# Patient Record
Sex: Male | Born: 1995 | Race: Black or African American | Hispanic: No | Marital: Single | Smoking: Current every day smoker
Health system: Southern US, Community
[De-identification: ages and names within clinical notes are randomized; demographics above are authoritative.]

---
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
## 2016-04-10 ENCOUNTER — Encounter (HOSPITAL_COMMUNITY): Payer: Self-pay | Admitting: *Deleted

## 2016-04-10 ENCOUNTER — Emergency Department (HOSPITAL_COMMUNITY): Payer: Medicaid Other

## 2016-04-10 ENCOUNTER — Emergency Department (HOSPITAL_COMMUNITY)
Admission: EM | Admit: 2016-04-10 | Discharge: 2016-04-10 | Disposition: A | Discharge status: Home / Self Care | Payer: Medicaid Other | Attending: Emergency Medicine | Admitting: Emergency Medicine

## 2016-04-10 DIAGNOSIS — Y939 Activity, unspecified: Secondary | ICD-10-CM | POA: Diagnosis not present

## 2016-04-10 DIAGNOSIS — S022XXA Fracture of nasal bones, initial encounter for closed fracture: Secondary | ICD-10-CM | POA: Diagnosis not present

## 2016-04-10 DIAGNOSIS — S0990XA Unspecified injury of head, initial encounter: Secondary | ICD-10-CM | POA: Insufficient documentation

## 2016-04-10 DIAGNOSIS — Y929 Unspecified place or not applicable: Secondary | ICD-10-CM | POA: Diagnosis not present

## 2016-04-10 DIAGNOSIS — Z791 Long term (current) use of non-steroidal anti-inflammatories (NSAID): Secondary | ICD-10-CM | POA: Insufficient documentation

## 2016-04-10 DIAGNOSIS — W2209XA Striking against other stationary object, initial encounter: Secondary | ICD-10-CM | POA: Diagnosis not present

## 2016-04-10 DIAGNOSIS — S0993XA Unspecified injury of face, initial encounter: Secondary | ICD-10-CM | POA: Diagnosis present

## 2016-04-10 DIAGNOSIS — Y999 Unspecified external cause status: Secondary | ICD-10-CM | POA: Insufficient documentation

## 2016-04-10 MED ORDER — NAPROXEN 500 MG PO TABS: 500.0000 mg | ORAL_TABLET | Freq: Two times a day (BID) | ORAL | Status: DC

## 2016-04-10 MED ORDER — OXYCODONE-ACETAMINOPHEN 5-325 MG PO TABS
1.0000 | ORAL_TABLET | ORAL | Status: DC | PRN
  Administered 2016-04-10: 1 via ORAL

## 2016-04-10 MED ORDER — OXYCODONE-ACETAMINOPHEN 5-325 MG PO TABS
ORAL_TABLET | ORAL | Status: AC
  Filled 2016-04-10: qty 1

## 2016-04-10 NOTE — ED Notes (Signed)
Pt not in room at time of discharge, did not receive paperwork or prescription for Naproxen.

## 2016-04-10 NOTE — Discharge Instructions (Signed)
Please read and follow all provided instructions.  Your diagnoses today include:  1. Nasal bone fracture, closed, initial encounter   2. Minor head injury, initial encounter     Tests performed today include:  CT scan of your face that showed nasal bone fractures and swelling, no other concerning injury.  Vital signs. See below for your results today.   Medications prescribed:   Naproxen - anti-inflammatory pain medication  Do not exceed 500mg  naproxen every 12 hours, take with food  You have been prescribed an anti-inflammatory medication or NSAID. Take with food. Take smallest effective dose for the shortest duration needed for your pain. Stop taking if you experience stomach pain or vomiting.   Take any prescribed medications only as directed.  Home care instructions:  Put ice on the area 15 minutes every hour and use NSAIDs for pain.   Follow-up instructions: Please follow-up with the doctor listed if you do not like the way your nose looks or you have difficulty breathing though your nose in one week.  Return instructions:  SEEK IMMEDIATE MEDICAL ATTENTION IF:  There is confusion or drowsiness (although children frequently become drowsy after injury).   You cannot awaken the injured person.   You have more than one episode of vomiting.   You notice dizziness or unsteadiness which is getting worse, or inability to walk.   You have convulsions or unconsciousness.   You experience severe, persistent headaches not relieved by Tylenol.  You cannot use arms or legs normally.   There are changes in pupil sizes. (This is the black center in the colored part of the eye)   There is clear or bloody discharge from the nose or ears.   You have change in speech, vision, swallowing, or understanding.   Localized weakness, numbness, tingling, or change in bowel or bladder control.  You have any other emergent concerns.  Additional Information: You have had a head injury  which does not appear to require admission at this time.  Your vital signs today were: BP 117/72 mmHg   Pulse 86   Temp(Src) 99.5 F (37.5 C) (Oral)   Resp 14   SpO2 99% If your blood pressure (BP) was elevated above 135/85 this visit, please have this repeated by your doctor within one month. --------------
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
## 2020-01-20 ENCOUNTER — Ambulatory Visit: Payer: Medicaid Other | Attending: Internal Medicine

## 2020-01-20 DIAGNOSIS — Z23 Encounter for immunization: Secondary | ICD-10-CM

## 2020-01-20 NOTE — Progress Notes (Signed)
   Covid-19 Vaccination Clinic  Name:  Bryan Ramsey    MRN: [id number] DOB: [birth year]  01/20/2020  Bryan Ramsey was observed post Covid-19 immunization for 15 minutes without incident. He was provided with Vaccine Information Sheet and instruction to access the V-Safe system.   Bryan Ramsey was instructed to call 911 with any severe reactions post vaccine: Marland Kitchen Difficulty breathing  . Swelling of face and throat  . A fast heartbeat  . A bad rash all over body  . Dizziness and weakness   Immunizations Administered    Name Date Dose VIS Date Route   Moderna COVID-19 Vaccine 01/20/2020 10:27 AM 0.5 mL 09/19/2019 Intramuscular   Manufacturer: Gala Murdoch   Lot: [id number]   NDC: 73578-978-47

## 2022-08-21 ENCOUNTER — Other Ambulatory Visit (HOSPITAL_COMMUNITY): Payer: Self-pay

## 2022-08-21 ENCOUNTER — Emergency Department (HOSPITAL_COMMUNITY)
Admission: EM | Admit: 2022-08-21 | Discharge: 2022-08-21 | Disposition: A | Discharge status: Home / Self Care | Payer: Medicaid Other | Attending: Emergency Medicine | Admitting: Emergency Medicine

## 2022-08-21 ENCOUNTER — Other Ambulatory Visit: Payer: Self-pay

## 2022-08-21 ENCOUNTER — Encounter (HOSPITAL_COMMUNITY): Payer: Self-pay

## 2022-08-21 DIAGNOSIS — Z202 Contact with and (suspected) exposure to infections with a predominantly sexual mode of transmission: Secondary | ICD-10-CM

## 2022-08-21 DIAGNOSIS — R590 Localized enlarged lymph nodes: Secondary | ICD-10-CM | POA: Insufficient documentation

## 2022-08-21 LAB — URINALYSIS, ROUTINE W REFLEX MICROSCOPIC
Bilirubin Urine: NEGATIVE
Glucose, UA: NEGATIVE mg/dL
Hgb urine dipstick: NEGATIVE
Ketones, ur: NEGATIVE mg/dL
Leukocytes,Ua: NEGATIVE
Nitrite: NEGATIVE
Protein, ur: NEGATIVE mg/dL
Specific Gravity, Urine: 1.023 (ref 1.005–1.030)
pH: 5 (ref 5.0–8.0)

## 2022-08-21 LAB — HIV ANTIBODY (ROUTINE TESTING W REFLEX): HIV Screen 4th Generation wRfx: NONREACTIVE

## 2022-08-21 LAB — RPR: RPR Ser Ql: NONREACTIVE

## 2022-08-21 MED ORDER — CEFTRIAXONE SODIUM 500 MG IJ SOLR
500.0000 mg | Freq: Once | INTRAMUSCULAR | Status: AC
  Administered 2022-08-21: 500 mg via INTRAMUSCULAR
  Filled 2022-08-21: qty 500

## 2022-08-21 MED ORDER — DOXYCYCLINE HYCLATE 100 MG PO TABS
100.0000 mg | ORAL_TABLET | Freq: Once | ORAL | Status: AC
  Administered 2022-08-21: 100 mg via ORAL
  Filled 2022-08-21: qty 1

## 2022-08-21 MED ORDER — STERILE WATER FOR INJECTION IJ SOLN
INTRAMUSCULAR | Status: AC
  Administered 2022-08-21: 10 mL
  Filled 2022-08-21: qty 10

## 2022-08-21 MED ORDER — DOXYCYCLINE HYCLATE 100 MG PO CAPS
100.0000 mg | ORAL_CAPSULE | Freq: Two times a day (BID) | ORAL | 0 refills | Status: DC
  Filled 2022-08-21: qty 20, 10d supply, fill #0

## 2022-08-21 NOTE — ED Provider Notes (Signed)
[facility] EMERGENCY DEPARTMENT Provider Note   CSN: [id number] Arrival date & time: 08/21/22  0856     History  Chief Complaint  Patient presents with   Exposure to STD    Bryan Ramsey is a 26 y.o. male.   Exposure to STD     Patient with noncontributory past medical history presents today due to STD exposure.  He has had multiple new sexual partners, they have not tested positive for STDs but he started having some intermittent penile discharge.  He also has swollen lymph nodes in his groin area.  Denies any new rashes or lesions.  No fevers or chills, abdominal pain.  Home Medications Prior to Admission medications   Medication Sig Start Date End Date Taking? Authorizing Provider  doxycycline (VIBRAMYCIN) 100 MG capsule Take 1 capsule (100 mg total) by mouth 2 (two) times daily. 08/21/22  Yes Sherrill Raring, PA-C  ibuprofen (ADVIL,MOTRIN) 600 MG tablet Take 1 tablet (600 mg total) by mouth every 8 (eight) hours as needed for pain. 09/09/12   Moreno-Coll, Adlih, MD  ibuprofen (ADVIL,MOTRIN) 800 MG tablet Take 1 tablet (800 mg total) by mouth every 8 (eight) hours as needed for moderate pain. 05/27/14   Larene Pickett, PA-C  mupirocin ointment (BACTROBAN) 2 % Apply 1 application topically 2 (two) times daily. 06/07/14   Waldemar Dickens, MD  naproxen (NAPROSYN) 500 MG tablet Take 1 tablet (500 mg total) by mouth 2 (two) times daily. 04/10/16   Carlisle Cater, PA-C      Allergies    Patient has no known allergies.    Review of Systems   Review of Systems  Physical Exam Updated Vital Signs BP 119/60 (BP Location: Right Arm)   Pulse 62   Temp 97.7 F (36.5 C) (Oral)   Resp 15   Ht 5\' 11"  (1.803 m)   Wt 77.1 kg   SpO2 100%   BMI 23.71 kg/m  Physical Exam Vitals and nursing note reviewed. Exam conducted with a chaperone present.  Constitutional:      Appearance: Normal appearance.  HENT:     Head: Normocephalic and atraumatic.  Eyes:     General: No  scleral icterus.       Right eye: No discharge.        Left eye: No discharge.     Extraocular Movements: Extraocular movements intact.     Pupils: Pupils are equal, round, and reactive to light.  Cardiovascular:     Rate and Rhythm: Normal rate and regular rhythm.     Pulses: Normal pulses.     Heart sounds: Normal heart sounds. No murmur heard.    No friction rub. No gallop.  Pulmonary:     Effort: Pulmonary effort is normal. No respiratory distress.     Breath sounds: Normal breath sounds.  Abdominal:     General: Abdomen is flat. Bowel sounds are normal. There is no distension.     Palpations: Abdomen is soft.     Tenderness: There is no abdominal tenderness.  Genitourinary:    Comments: Mild inguinal lymphadenopathy bilaterally Skin:    General: Skin is warm and dry.     Coloration: Skin is not jaundiced.  Neurological:     Mental Status: He is alert. Mental status is at baseline.     Coordination: Coordination normal.     ED Results / Procedures / Treatments   Labs (all labs ordered are listed, but only abnormal results are displayed) Labs Reviewed  HIV ANTIBODY (ROUTINE TESTING W REFLEX)  URINALYSIS, ROUTINE W REFLEX MICROSCOPIC  RPR  GC/CHLAMYDIA PROBE AMP ([place]) NOT AT [facility]    EKG None  Radiology No results found.  Procedures Procedures    Medications Ordered in ED Medications  cefTRIAXone (ROCEPHIN) injection 500 mg (has no administration in time range)  doxycycline (VIBRA-TABS) tablet 100 mg (has no administration in time range)    ED Course/ Medical Decision Making/ A&P                           Medical Decision Making Amount and/or Complexity of Data Reviewed Labs: ordered.  Risk Prescription drug management.   Patient presents wanting STD testing and possible treatment.  He is nonseptic appearing, his abdomen is soft nontender.  He has some mild lymphadenopathy but no appreciable discharge, chancre, rashes, skin discoloration.   Empirically will treat with Rocephin and Doxy.  Follow-up at health department and PCP was discussed with the patient who verbalized understanding.  Return precautions also discussed.  Do not think any additional work-up is indicated.        Final Clinical Impression(s) / ED Diagnoses Final diagnoses:  Possible exposure to STD    Rx / DC Orders ED Discharge Orders          Ordered    doxycycline (VIBRAMYCIN) 100 MG capsule  2 times daily        08/21/22 1236              Sherrill Raring, [place] 08/21/22 1245    Valarie Merino, MD 08/22/22 1308

## 2022-08-21 NOTE — Discharge Instructions (Addendum)
Check MyChart for the results of your STD screen.  If you have syphilis you will need to be treated with a different antibiotic.  If the chlamydia is positive you will need to continue the doxycycline.  If negative you do not need to continue the doxycycline.  You are treated empirically for gonorrhea here in the emergency department with the shot.  Follow-up with the health department in future for all your STD testing needs.  Return to the ED for emergent symptoms.
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
## 2022-08-24 LAB — GC/CHLAMYDIA PROBE AMP (~~LOC~~) NOT AT ARMC
Chlamydia: NEGATIVE
Comment: NEGATIVE
Comment: NORMAL
Neisseria Gonorrhea: NEGATIVE

## 2022-08-25 ENCOUNTER — Emergency Department (HOSPITAL_COMMUNITY)
Admission: EM | Admit: 2022-08-25 | Discharge: 2022-08-26 | Disposition: A | Discharge status: Home / Self Care | Payer: Medicaid Other | Attending: Emergency Medicine | Admitting: Emergency Medicine

## 2022-08-25 ENCOUNTER — Encounter (HOSPITAL_COMMUNITY): Payer: Self-pay | Admitting: Emergency Medicine

## 2022-08-25 ENCOUNTER — Emergency Department (HOSPITAL_COMMUNITY): Payer: Medicaid Other

## 2022-08-25 ENCOUNTER — Other Ambulatory Visit: Payer: Self-pay

## 2022-08-25 DIAGNOSIS — S0083XA Contusion of other part of head, initial encounter: Secondary | ICD-10-CM

## 2022-08-25 DIAGNOSIS — S20419A Abrasion of unspecified back wall of thorax, initial encounter: Secondary | ICD-10-CM

## 2022-08-25 DIAGNOSIS — S0081XA Abrasion of other part of head, initial encounter: Secondary | ICD-10-CM

## 2022-08-25 DIAGNOSIS — Y9289 Other specified places as the place of occurrence of the external cause: Secondary | ICD-10-CM | POA: Insufficient documentation

## 2022-08-25 DIAGNOSIS — S300XXA Contusion of lower back and pelvis, initial encounter: Secondary | ICD-10-CM
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
## 2022-08-31 ENCOUNTER — Other Ambulatory Visit (HOSPITAL_COMMUNITY): Payer: Self-pay

## 2022-09-18 DIAGNOSIS — Z419 Encounter for procedure for purposes other than remedying health state, unspecified: Secondary | ICD-10-CM | POA: Diagnosis not present

## 2022-10-19 DIAGNOSIS — Z419 Encounter for procedure for purposes other than remedying health state, unspecified: Secondary | ICD-10-CM | POA: Diagnosis not present

## 2023-02-15 ENCOUNTER — Emergency Department (HOSPITAL_COMMUNITY)
Admission: EM | Admit: 2023-02-15 | Discharge: 2023-02-15 | Discharge status: Against Medical Advice | Payer: Medicaid Other

## 2023-02-15 ENCOUNTER — Other Ambulatory Visit: Payer: Self-pay

## 2023-02-15 NOTE — ED Notes (Signed)
No answer when called for triage 

## 2023-02-15 NOTE — ED Notes (Signed)
No answer for triage x3 

## 2023-02-24 ENCOUNTER — Other Ambulatory Visit: Payer: Self-pay

## 2023-02-24 ENCOUNTER — Ambulatory Visit (HOSPITAL_COMMUNITY)
Admission: EM | Admit: 2023-02-24 | Discharge: 2023-02-24 | Disposition: A | Discharge status: Home / Self Care | Payer: Medicaid Other | Attending: Emergency Medicine | Admitting: Emergency Medicine

## 2023-02-24 ENCOUNTER — Other Ambulatory Visit (HOSPITAL_COMMUNITY): Payer: Self-pay

## 2023-02-24 ENCOUNTER — Encounter (HOSPITAL_COMMUNITY): Payer: Self-pay | Admitting: Emergency Medicine

## 2023-02-24 DIAGNOSIS — H1131 Conjunctival hemorrhage, right eye: Secondary | ICD-10-CM

## 2023-02-24 DIAGNOSIS — S0501XA Injury of conjunctiva and corneal abrasion without foreign body, right eye, initial encounter: Secondary | ICD-10-CM

## 2023-02-24 DIAGNOSIS — H10021 Other mucopurulent conjunctivitis, right eye: Secondary | ICD-10-CM

## 2023-02-24 MED ORDER — GENTAMICIN SULFATE 0.3 % OP SOLN
1.0000 [drp] | OPHTHALMIC | 0 refills | Status: AC
  Filled 2023-02-24: qty 5, 5d supply, fill #0

## 2023-02-24 NOTE — Discharge Instructions (Addendum)
You have a subconjunctival hemorrhage of your right eye, I believe this was due to trauma and corneal abrasion.  I would to cover you with gentamicin drops to prevent against bacterial infection.  You can do a cool rag to your eye to help with the swelling and discomfort.  Please use a clean rag or clean section every times you do not reinfect yourself.  I have attached the information for an ophthalmologist, please call them if your conjunctival hemorrhage does not improve over the next few weeks, you have sudden vision loss, eye pain, or any new concerning symptoms.  If you are unable to get into their office, please seek immediate care.
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
## 2023-02-25 ENCOUNTER — Other Ambulatory Visit (HOSPITAL_COMMUNITY): Payer: Self-pay

## 2023-03-09 ENCOUNTER — Other Ambulatory Visit (HOSPITAL_COMMUNITY): Payer: Self-pay

## 2024-02-27 ENCOUNTER — Emergency Department (HOSPITAL_COMMUNITY)

## 2024-02-27 ENCOUNTER — Other Ambulatory Visit: Payer: Self-pay

## 2024-02-27 ENCOUNTER — Emergency Department (HOSPITAL_COMMUNITY)
Admission: EM | Admit: 2024-02-27 | Discharge: 2024-02-27 | Disposition: A | Discharge status: Home / Self Care | Attending: Emergency Medicine | Admitting: Emergency Medicine

## 2024-02-27 ENCOUNTER — Encounter (HOSPITAL_COMMUNITY): Payer: Self-pay

## 2024-02-27 DIAGNOSIS — S6992XA Unspecified injury of left wrist, hand and finger(s), initial encounter: Secondary | ICD-10-CM | POA: Diagnosis present

## 2024-02-27 DIAGNOSIS — S81812A Laceration without foreign body, left lower leg, initial encounter: Secondary | ICD-10-CM

## 2024-02-27 DIAGNOSIS — S61217A Laceration without foreign body of left little finger without damage to nail, initial encounter: Secondary | ICD-10-CM | POA: Diagnosis not present

## 2024-02-27 DIAGNOSIS — S61412A Laceration without foreign body of left hand, initial encounter: Secondary | ICD-10-CM | POA: Insufficient documentation

## 2024-02-27 DIAGNOSIS — S61215A Laceration without foreign body of left ring finger without damage to nail, initial encounter: Secondary | ICD-10-CM | POA: Diagnosis not present

## 2024-02-27 DIAGNOSIS — S91012A Laceration without foreign body, left ankle, initial encounter: Secondary | ICD-10-CM | POA: Insufficient documentation

## 2024-02-27 MED ORDER — LIDOCAINE HCL (PF) 1 % IJ SOLN
10.0000 mL | Freq: Once | INTRAMUSCULAR | Status: AC
  Administered 2024-02-27: 10 mL
  Filled 2024-02-27: qty 10

## 2024-02-27 MED ORDER — SENNOSIDES-DOCUSATE SODIUM 8.6-50 MG PO TABS: 1.0000 | ORAL_TABLET | Freq: Every day | ORAL | 0 refills | Status: AC

## 2024-02-27 MED ORDER — OXYCODONE-ACETAMINOPHEN 5-325 MG PO TABS: 1.0000 | ORAL_TABLET | Freq: Four times a day (QID) | ORAL | 0 refills | Status: DC | PRN

## 2024-02-27 MED ORDER — DOXYCYCLINE HYCLATE 100 MG PO CAPS: 100.0000 mg | ORAL_CAPSULE | Freq: Two times a day (BID) | ORAL | 0 refills | Status: AC

## 2024-02-27 MED ORDER — OXYCODONE-ACETAMINOPHEN 5-325 MG PO TABS
1.0000 | ORAL_TABLET | Freq: Once | ORAL | Status: AC
  Administered 2024-02-27: 1 via ORAL
  Filled 2024-02-27: qty 1

## 2024-02-27 MED ORDER — DOXYCYCLINE HYCLATE 100 MG PO TABS
100.0000 mg | ORAL_TABLET | Freq: Once | ORAL | Status: AC
  Administered 2024-02-27: 100 mg via ORAL
  Filled 2024-02-27: qty 1

## 2024-02-27 MED ORDER — LIDOCAINE-EPINEPHRINE (PF) 2 %-1:200000 IJ SOLN
10.0000 mL | Freq: Once | INTRAMUSCULAR | Status: AC
  Administered 2024-02-27: 10 mL
  Filled 2024-02-27: qty 20
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
## 2024-02-27 NOTE — Discharge Instructions (Signed)
 Please keep your hand and leg clean and dry. Please start the antibiotics and take as directed. You will need to return in 7 days to have your stitches removed. If you develop numbness in the hand you should call the surgeon listed for close follow up.

## 2024-02-27 NOTE — ED Provider Notes (Cosign Needed Addendum)
 LACERATION REPAIR Performed by: Adel Aden Authorized by: Adel Aden Consent: Verbal consent obtained. Risks and benefits: risks, benefits and alternatives were discussed Consent given by: patient Patient identity confirmed: provided demographic data Prepped and Draped in normal sterile fashion Wound explored  Laceration Location: distal left ankle  Laceration Length: 4cm  No Foreign Bodies seen or palpated  Anesthesia: local infiltration  Local anesthetic: lidocaine  2% w epinephrine  Anesthetic total: 5 ml  Irrigation method: syringe Amount of cleaning: standard  Skin closure: close   Number of sutures: 6  Technique: simple  Patient tolerance: Patient tolerated the procedure well with no immediate complications.          LACERATION REPAIR Performed by: Adel Aden Authorized by: Adel Aden Consent: Verbal consent obtained. Risks and benefits: risks, benefits and alternatives were discussed Consent given by: patient Patient identity confirmed: provided demographic data Prepped and Draped in normal sterile fashion Wound explored  Laceration Location: distal left ankle   Laceration Length: 2.5cm  No Foreign Bodies seen or palpated  Anesthesia: local infiltration  Local anesthetic: lidocaine  2% w epinephrine  Anesthetic total: 5 ml  Irrigation method: syringe Amount of cleaning: standard  Skin closure: close  Number of sutures: 3  Technique: simple repair  Patient tolerance: Patient tolerated the procedure well with no immediate complications.      Adel Aden, PA-C 02/27/24 0540    Adel Aden, PA-C 02/27/24 0541    Long, Joshua G, MD 02/28/24 [phone]
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
## 2024-03-03 ENCOUNTER — Ambulatory Visit (HOSPITAL_COMMUNITY)
Admission: EM | Admit: 2024-03-03 | Discharge: 2024-03-03 | Disposition: A | Discharge status: Home / Self Care | Attending: Physician Assistant | Admitting: Physician Assistant

## 2024-03-03 ENCOUNTER — Encounter (HOSPITAL_COMMUNITY): Payer: Self-pay

## 2024-03-03 ENCOUNTER — Other Ambulatory Visit (HOSPITAL_COMMUNITY): Payer: Self-pay

## 2024-03-03 DIAGNOSIS — S91012D Laceration without foreign body, left ankle, subsequent encounter: Secondary | ICD-10-CM | POA: Diagnosis not present

## 2024-03-03 DIAGNOSIS — Z5189 Encounter for other specified aftercare: Secondary | ICD-10-CM | POA: Diagnosis not present

## 2024-03-03 DIAGNOSIS — S61412D Laceration without foreign body of left hand, subsequent encounter: Secondary | ICD-10-CM

## 2024-03-03 MED ORDER — HYDROCODONE-ACETAMINOPHEN 5-325 MG PO TABS
1.0000 | ORAL_TABLET | Freq: Two times a day (BID) | ORAL | 0 refills | Status: AC | PRN
  Filled 2024-03-03 – 2024-03-04 (×2): qty 6, 3d supply, fill #0

## 2024-03-03 NOTE — Discharge Instructions (Signed)
 I have called in a few doses of hydrocodone.  As we discussed, I am unable to provide any refills of this medication.  Try to limit use is much as possible that is both addictive and sedating.  Do not drive or drink alcohol with taking this medication.  Continue the antibiotics as prescribed.  It is very important that you follow-up with a hand specialist as soon as possible.  Please call them and schedule an appointment today.  If you have any worsening symptoms including discoloration of your finger, increasing numbness, wound you need to go to the emergency room immediately.
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
## 2024-03-04 ENCOUNTER — Other Ambulatory Visit (HOSPITAL_COMMUNITY): Payer: Self-pay

## 2024-03-06 ENCOUNTER — Other Ambulatory Visit (HOSPITAL_COMMUNITY): Payer: Self-pay

## 2024-03-11 ENCOUNTER — Other Ambulatory Visit: Payer: Self-pay

## 2024-03-11 ENCOUNTER — Observation Stay (HOSPITAL_COMMUNITY)
Admission: EM | Admit: 2024-03-11 | Discharge: 2024-03-12 | Disposition: A | Discharge status: Home / Self Care | Source: Home / Self Care | Attending: Emergency Medicine | Admitting: Emergency Medicine

## 2024-03-11 ENCOUNTER — Ambulatory Visit (HOSPITAL_COMMUNITY): Admission: EM | Admit: 2024-03-11 | Discharge: 2024-03-11 | Disposition: A | Discharge status: Home / Self Care

## 2024-03-11 ENCOUNTER — Encounter (HOSPITAL_COMMUNITY): Payer: Self-pay

## 2024-03-11 ENCOUNTER — Observation Stay (HOSPITAL_COMMUNITY)
Admission: EM | Admit: 2024-03-11 | Discharge: 2024-03-11 | Discharge status: Against Medical Advice | Attending: Emergency Medicine | Admitting: Emergency Medicine

## 2024-03-11 ENCOUNTER — Emergency Department (HOSPITAL_COMMUNITY)

## 2024-03-11 ENCOUNTER — Emergency Department (HOSPITAL_COMMUNITY)
Admission: EM | Admit: 2024-03-11 | Discharge: 2024-03-11 | Discharge status: Against Medical Advice | Source: Home / Self Care

## 2024-03-11 DIAGNOSIS — S0993XA Unspecified injury of face, initial encounter: Secondary | ICD-10-CM | POA: Diagnosis present

## 2024-03-11 DIAGNOSIS — Z5321 Procedure and treatment not carried out due to patient leaving prior to being seen by health care provider: Secondary | ICD-10-CM | POA: Insufficient documentation

## 2024-03-11 DIAGNOSIS — S00502A Unspecified superficial injury of oral cavity, initial encounter: Secondary | ICD-10-CM | POA: Insufficient documentation

## 2024-03-11 DIAGNOSIS — Z23 Encounter for immunization: Secondary | ICD-10-CM | POA: Insufficient documentation

## 2024-03-11 DIAGNOSIS — R220 Localized swelling, mass and lump, head: Principal | ICD-10-CM

## 2024-03-11 DIAGNOSIS — T1490XA Injury, unspecified, initial encounter: Secondary | ICD-10-CM | POA: Diagnosis present

## 2024-03-11 DIAGNOSIS — T797XXA Traumatic subcutaneous emphysema, initial encounter: Secondary | ICD-10-CM | POA: Diagnosis not present

## 2024-03-11 DIAGNOSIS — W503XXA Accidental bite by another person, initial encounter: Secondary | ICD-10-CM

## 2024-03-11 DIAGNOSIS — S270XXA Traumatic pneumothorax, initial encounter: Secondary | ICD-10-CM | POA: Diagnosis not present

## 2024-03-11 DIAGNOSIS — W1830XA Fall on same level, unspecified, initial encounter: Secondary | ICD-10-CM | POA: Insufficient documentation

## 2024-03-11 DIAGNOSIS — R519 Headache, unspecified: Secondary | ICD-10-CM | POA: Insufficient documentation

## 2024-03-11 DIAGNOSIS — S01502A Unspecified open wound of oral cavity, initial encounter: Secondary | ICD-10-CM | POA: Diagnosis not present

## 2024-03-11 DIAGNOSIS — S01512A Laceration without foreign body of oral cavity, initial encounter: Secondary | ICD-10-CM

## 2024-03-11 DIAGNOSIS — S21151A Open bite of right front wall of thorax without penetration into thoracic cavity, initial encounter: Secondary | ICD-10-CM | POA: Insufficient documentation

## 2024-03-11 DIAGNOSIS — J939 Pneumothorax, unspecified: Secondary | ICD-10-CM

## 2024-03-11 DIAGNOSIS — S21159A Open bite of unspecified front wall of thorax without penetration into thoracic cavity, initial encounter: Secondary | ICD-10-CM

## 2024-03-11 DIAGNOSIS — J982 Interstitial emphysema: Secondary | ICD-10-CM | POA: Insufficient documentation

## 2024-03-11 LAB — CBC WITH DIFFERENTIAL/PLATELET
Abs Immature Granulocytes: 0.03 10*3/uL (ref 0.00–0.07)
Basophils Absolute: 0 10*3/uL (ref 0.0–0.1)
Basophils Relative: 0 %
Eosinophils Absolute: 0 10*3/uL (ref 0.0–0.5)
Eosinophils Relative: 0 %
HCT: 41.7 % (ref 39.0–52.0)
Hemoglobin: 14.3 g/dL (ref 13.0–17.0)
Immature Granulocytes: 0 %
Lymphocytes Relative: 9 %
Lymphs Abs: 1 10*3/uL (ref 0.7–4.0)
MCH: 31.7 pg (ref 26.0–34.0)
MCHC: 34.3 g/dL (ref 30.0–36.0)
MCV: 92.5 fL (ref 80.0–100.0)
Monocytes Absolute: 0.9 10*3/uL (ref 0.1–1.0)
Monocytes Relative: 9 %
Neutro Abs: 8.8 10*3/uL — ABNORMAL HIGH (ref 1.7–7.7)
Neutrophils Relative %: 82 %
Platelets: 304 10*3/uL (ref 150–400)
RBC: 4.51 MIL/uL (ref 4.22–5.81)
RDW: 13 % (ref 11.5–15.5)
WBC: 10.8 10*3/uL — ABNORMAL HIGH (ref 4.0–10.5)
nRBC: 0 % (ref 0.0–0.2)

## 2024-03-11 LAB — BASIC METABOLIC PANEL WITH GFR
Anion gap: 7 (ref 5–15)
BUN: 9 mg/dL (ref 6–20)
CO2: 24 mmol/L (ref 22–32)
Calcium: 9.1 mg/dL (ref 8.9–10.3)
Chloride: 109 mmol/L (ref 98–111)
Creatinine, Ser: 0.91 mg/dL (ref 0.61–1.24)
GFR, Estimated: >60 mL/min (ref >60)
Glucose, Bld: 83 mg/dL (ref 70–99)
Potassium: 3.8 mmol/L (ref 3.5–5.1)
Sodium: 140 mmol/L (ref 135–145)

## 2024-03-11 MED ORDER — ONDANSETRON HCL 4 MG/2ML IJ SOLN
4.0000 mg | Freq: Once | INTRAMUSCULAR | Status: AC
  Administered 2024-03-11: 4 mg via INTRAVENOUS
  Filled 2024-03-11: qty 2

## 2024-03-11 MED ORDER — HYDROMORPHONE HCL 1 MG/ML IJ SOLN: 1.0000 mg | INTRAMUSCULAR | Status: DC | PRN

## 2024-03-11 MED ORDER — ONDANSETRON 4 MG PO TBDP: 4.0000 mg | ORAL_TABLET | Freq: Four times a day (QID) | ORAL | Status: DC | PRN

## 2024-03-11 MED ORDER — ENOXAPARIN SODIUM 30 MG/0.3ML IJ SOSY: 30.0000 mg | PREFILLED_SYRINGE | Freq: Two times a day (BID) | INTRAMUSCULAR | Status: DC

## 2024-03-11 MED ORDER — ONDANSETRON HCL 4 MG/2ML IJ SOLN: 4.0000 mg | Freq: Four times a day (QID) | INTRAMUSCULAR | Status: DC | PRN

## 2024-03-11 MED ORDER — METHOCARBAMOL 1000 MG/10ML IJ SOLN: 500.0000 mg | Freq: Three times a day (TID) | INTRAMUSCULAR | Status: DC

## 2024-03-11 MED ORDER — METOPROLOL TARTRATE 5 MG/5ML IV SOLN: 5.0000 mg | Freq: Four times a day (QID) | INTRAVENOUS | Status: DC | PRN

## 2024-03-11 MED ORDER — LACTATED RINGERS IV SOLN: INTRAVENOUS | Status: DC

## 2024-03-11 MED ORDER — DOCUSATE SODIUM 100 MG PO CAPS: 100.0000 mg | ORAL_CAPSULE | Freq: Two times a day (BID) | ORAL | Status: DC

## 2024-03-11 MED ORDER — IOPAMIDOL (ISOVUE-370) INJECTION 76%
75.0000 mL | Freq: Once | INTRAVENOUS | Status: AC | PRN
  Administered 2024-03-11: 75 mL via INTRAVENOUS

## 2024-03-11 MED ORDER — POLYETHYLENE GLYCOL 3350 17 G PO PACK: 17.0000 g | PACK | Freq: Every day | ORAL | Status: DC | PRN

## 2024-03-11 MED ORDER — METHOCARBAMOL 500 MG PO TABS: 500.0000 mg | ORAL_TABLET | Freq: Three times a day (TID) | ORAL | Status: DC

## 2024-03-11 MED ORDER — TETANUS-DIPHTH-ACELL PERTUSSIS 5-2.5-18.5 LF-MCG/0.5 IM SUSY
0.5000 mL | PREFILLED_SYRINGE | Freq: Once | INTRAMUSCULAR | Status: AC
  Administered 2024-03-11: 0.5 mL via INTRAMUSCULAR
  Filled 2024-03-11: qty 0.5

## 2024-03-11 MED ORDER — OXYCODONE HCL 5 MG PO TABS: 10.0000 mg | ORAL_TABLET | ORAL | Status: DC | PRN

## 2024-03-11 MED ORDER — LORAZEPAM 2 MG/ML IJ SOLN
2.0000 mg | Freq: Once | INTRAMUSCULAR | Status: AC
  Administered 2024-03-11: 2 mg via INTRAVENOUS
  Filled 2024-03-11: qty 1

## 2024-03-11 MED ORDER — ACETAMINOPHEN 500 MG PO TABS: 1000.0000 mg | ORAL_TABLET | Freq: Four times a day (QID) | ORAL | Status: DC

## 2024-03-11 MED ORDER — MORPHINE SULFATE (PF) 4 MG/ML IV SOLN
4.0000 mg | Freq: Once | INTRAVENOUS | Status: AC
  Administered 2024-03-11: 4 mg via INTRAVENOUS
  Filled 2024-03-11: qty 1

## 2024-03-11 MED ORDER — HYDRALAZINE HCL 20 MG/ML IJ SOLN: 10.0000 mg | INTRAMUSCULAR | Status: DC | PRN

## 2024-03-11 MED ORDER — OXYCODONE HCL 5 MG PO TABS: 5.0000 mg | ORAL_TABLET | ORAL | Status: DC | PRN

## 2024-03-11 MED ORDER — GABAPENTIN 300 MG PO CAPS: 300.0000 mg | ORAL_CAPSULE | Freq: Three times a day (TID) | ORAL | Status: DC

## 2024-03-11 MED ORDER — FENTANYL CITRATE PF 50 MCG/ML IJ SOSY
50.0000 ug | PREFILLED_SYRINGE | Freq: Once | INTRAMUSCULAR | Status: AC
  Administered 2024-03-11: 50 ug via INTRAVENOUS
  Filled 2024-03-11: qty 1

## 2024-03-11 MED ORDER — OXYCODONE-ACETAMINOPHEN 5-325 MG PO TABS
1.0000 | ORAL_TABLET | Freq: Once | ORAL | Status: AC
  Administered 2024-03-11: 1 via ORAL
  Filled 2024-03-11: qty 1
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
## 2024-03-11 NOTE — ED Triage Notes (Addendum)
 Pt in triage and refusing Vital signs and pacing the room. He said everything is the same since the last time.   Pt left 2x already without telling anyone.  Visitor with him is very kind and telling him to calm down.  Pt did not believe we were trying to help him and that is "why I left"
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
## 2024-03-11 NOTE — ED Notes (Signed)
 Patient is being discharged from the Urgent Care and sent to the Emergency Department via personal opperated vehicle . Per Levora Reas NP, patient is in need of higher level of care due to facial swelling. Patient is aware and verbalizes understanding of plan of care.  Vitals:   03/11/24 1128  BP: 114/64  Pulse: 78  Resp: 16  Temp: 97.9 F (36.6 C)  SpO2: 99%
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
## 2024-03-11 NOTE — ED Notes (Signed)
 Please have patient call his mother asap
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
## 2024-03-12 ENCOUNTER — Emergency Department (HOSPITAL_COMMUNITY)

## 2024-03-12 ENCOUNTER — Emergency Department (HOSPITAL_COMMUNITY)
Admission: EM | Admit: 2024-03-12 | Discharge: 2024-03-12 | Disposition: A | Discharge status: Home / Self Care | Attending: Emergency Medicine | Admitting: Emergency Medicine

## 2024-03-12 ENCOUNTER — Other Ambulatory Visit: Payer: Self-pay

## 2024-03-12 DIAGNOSIS — M79672 Pain in left foot: Secondary | ICD-10-CM | POA: Diagnosis not present

## 2024-03-12 DIAGNOSIS — S270XXA Traumatic pneumothorax, initial encounter: Secondary | ICD-10-CM | POA: Diagnosis not present

## 2024-03-12 DIAGNOSIS — T797XXA Traumatic subcutaneous emphysema, initial encounter: Secondary | ICD-10-CM | POA: Insufficient documentation

## 2024-03-12 DIAGNOSIS — S0993XA Unspecified injury of face, initial encounter: Secondary | ICD-10-CM | POA: Diagnosis present

## 2024-03-12 DIAGNOSIS — S01502A Unspecified open wound of oral cavity, initial encounter: Secondary | ICD-10-CM | POA: Insufficient documentation

## 2024-03-12 DIAGNOSIS — J982 Interstitial emphysema: Secondary | ICD-10-CM

## 2024-03-12 MED ORDER — NICOTINE 21 MG/24HR TD PT24: 21.0000 mg | MEDICATED_PATCH | Freq: Every day | TRANSDERMAL | Status: DC

## 2024-03-12 MED ORDER — IBUPROFEN 800 MG PO TABS: 800.0000 mg | ORAL_TABLET | Freq: Three times a day (TID) | ORAL | 0 refills | Status: AC

## 2024-03-12 MED ORDER — AMOXICILLIN-POT CLAVULANATE 875-125 MG PO TABS
1.0000 | ORAL_TABLET | Freq: Once | ORAL | Status: AC
  Administered 2024-03-12: 1 via ORAL
  Filled 2024-03-12: qty 1

## 2024-03-12 MED ORDER — OXYCODONE HCL 5 MG PO TABS: 2.5000 mg | ORAL_TABLET | Freq: Four times a day (QID) | ORAL | 0 refills | Status: DC | PRN

## 2024-03-12 MED ORDER — OXYCODONE HCL 5 MG PO TABS
5.0000 mg | ORAL_TABLET | Freq: Once | ORAL | Status: AC
  Administered 2024-03-12: 5 mg via ORAL
  Filled 2024-03-12: qty 1

## 2024-03-12 MED ORDER — AMOXICILLIN-POT CLAVULANATE 875-125 MG PO TABS: 1.0000 | ORAL_TABLET | Freq: Two times a day (BID) | ORAL | 0 refills | Status: AC
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
## 2024-03-12 NOTE — ED Notes (Signed)
 Patient transported to CT

## 2024-03-12 NOTE — ED Notes (Signed)
 Patient was requesting to leave. This nurse explained again to the patient that the condition he is experiencing is called a pneumothorax. From what the attending explained it was a small amount of air trapped between his chest wall and lung. This nurse explained that he is being admitted to monitor this space of air, if he chose to leave there is no way for us  to monitor him. This nurse than proceeded to explain the risks of leaving such as the space of air becoming larger and placing stress on his lung causing difficulty breathing. After explaining this to the patient, patient stated he will "be back first thing in the morning". This nurse went ahead and removed patient's IV, patient walked out shortly there after.

## 2024-03-12 NOTE — Discharge Instructions (Signed)
 Please read the attached information packets  especially about your mouth wound care. Contact a health care provider if: You have a fever. Get help right away if: You have worsening pain in your chest, neck, jaw, or arms. You have trouble breathing. You have new problems with speaking or swallowing. These symptoms may be an emergency. Get help right away. Call 911. Do not wait to see if the symptoms will go away. Do not drive yourself to the hospital.
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
## 2024-03-12 NOTE — ED Notes (Signed)
 CCMD contacted
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
## 2024-03-15 ENCOUNTER — Encounter (HOSPITAL_COMMUNITY): Payer: Self-pay | Admitting: Emergency Medicine

## 2024-03-15 ENCOUNTER — Ambulatory Visit (HOSPITAL_COMMUNITY)
Admission: EM | Admit: 2024-03-15 | Discharge: 2024-03-15 | Disposition: A | Discharge status: Home / Self Care | Attending: Family Medicine | Admitting: Family Medicine

## 2024-03-15 ENCOUNTER — Other Ambulatory Visit: Payer: Self-pay

## 2024-03-15 ENCOUNTER — Other Ambulatory Visit (HOSPITAL_COMMUNITY): Payer: Self-pay

## 2024-03-15 DIAGNOSIS — M79672 Pain in left foot: Secondary | ICD-10-CM

## 2024-03-15 MED ORDER — OXYCODONE-ACETAMINOPHEN 5-325 MG PO TABS
1.0000 | ORAL_TABLET | Freq: Four times a day (QID) | ORAL | 0 refills | Status: AC | PRN
  Filled 2024-03-15 (×2): qty 12, 3d supply, fill #0

## 2024-03-15 MED ORDER — GABAPENTIN 300 MG PO CAPS
300.0000 mg | ORAL_CAPSULE | Freq: Three times a day (TID) | ORAL | 0 refills | Status: AC
  Filled 2024-03-15 (×2): qty 90, 30d supply, fill #0
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
## 2024-03-15 NOTE — Discharge Instructions (Signed)
 Remember to make an orthopaedic follow up appointment so that they can further evaluation your foot pain.  Be aware, you have been prescribed pain medications that may cause drowsiness. While taking this medication, do not take any other medications containing acetaminophen  (Tylenol ). Do not combine with alcohol or recreational drugs. Please do not drive, operate heavy machinery, or take part in activities that require making important decisions while on this medication as your judgement may be clouded.

## 2024-03-15 NOTE — ED Notes (Signed)
 Called patient, states coming inside building
